# Patient Record
Sex: Female | Born: 1970 | Race: White | Hispanic: No | Marital: Married | Smoking: Never smoker
Health system: Southern US, Community
[De-identification: ages and names within clinical notes are randomized; demographics above are authoritative.]

## PROBLEM LIST (undated history)

## (undated) DIAGNOSIS — R519 Headache, unspecified: Secondary | ICD-10-CM

## (undated) DIAGNOSIS — N3010 Interstitial cystitis (chronic) without hematuria: Secondary | ICD-10-CM

## (undated) DIAGNOSIS — R51 Headache: Secondary | ICD-10-CM

## (undated) HISTORY — DX: Headache: R51

## (undated) HISTORY — DX: Headache, unspecified: R51.9

## (undated) HISTORY — PX: ABDOMINAL HYSTERECTOMY: SHX81

## (undated) HISTORY — DX: Interstitial cystitis (chronic) without hematuria: N30.10

---

## 1998-03-18 ENCOUNTER — Other Ambulatory Visit: Admission: RE | Admit: 1998-03-18 | Discharge: 1998-03-18 | Payer: Self-pay | Admitting: *Deleted

## 1998-07-29 ENCOUNTER — Ambulatory Visit (HOSPITAL_COMMUNITY): Admission: RE | Admit: 1998-07-29 | Discharge: 1998-07-29 | Payer: Self-pay | Admitting: Obstetrics and Gynecology

## 1998-09-06 ENCOUNTER — Inpatient Hospital Stay (HOSPITAL_COMMUNITY): Admission: AD | Admit: 1998-09-06 | Discharge: 1998-09-06 | Payer: Self-pay | Admitting: Obstetrics and Gynecology

## 1998-10-07 ENCOUNTER — Inpatient Hospital Stay (HOSPITAL_COMMUNITY): Admission: AD | Admit: 1998-10-07 | Discharge: 1998-10-09 | Payer: Self-pay | Admitting: *Deleted

## 1998-10-09 ENCOUNTER — Encounter (HOSPITAL_COMMUNITY): Admission: RE | Admit: 1998-10-09 | Discharge: 1999-01-07 | Payer: Self-pay | Admitting: Obstetrics and Gynecology

## 1998-11-05 ENCOUNTER — Other Ambulatory Visit: Admission: RE | Admit: 1998-11-05 | Discharge: 1998-11-05 | Payer: Self-pay | Admitting: *Deleted

## 1999-05-13 ENCOUNTER — Encounter (INDEPENDENT_AMBULATORY_CARE_PROVIDER_SITE_OTHER): Payer: Self-pay | Admitting: Specialist

## 1999-05-13 ENCOUNTER — Other Ambulatory Visit: Admission: RE | Admit: 1999-05-13 | Discharge: 1999-05-13 | Payer: Self-pay | Admitting: Otolaryngology

## 1999-11-11 ENCOUNTER — Other Ambulatory Visit: Admission: RE | Admit: 1999-11-11 | Discharge: 1999-11-11 | Payer: Self-pay | Admitting: *Deleted

## 2000-08-18 ENCOUNTER — Ambulatory Visit (HOSPITAL_COMMUNITY): Admission: RE | Admit: 2000-08-18 | Discharge: 2000-08-18 | Payer: Self-pay | Admitting: *Deleted

## 2000-10-16 ENCOUNTER — Inpatient Hospital Stay (HOSPITAL_COMMUNITY): Admission: AD | Admit: 2000-10-16 | Discharge: 2000-10-16 | Payer: Self-pay | Admitting: *Deleted

## 2000-10-26 ENCOUNTER — Inpatient Hospital Stay (HOSPITAL_COMMUNITY): Admission: AD | Admit: 2000-10-26 | Discharge: 2000-10-29 | Payer: Self-pay | Admitting: Obstetrics and Gynecology

## 2000-12-12 ENCOUNTER — Other Ambulatory Visit: Admission: RE | Admit: 2000-12-12 | Discharge: 2000-12-12 | Payer: Self-pay | Admitting: *Deleted

## 2001-12-20 ENCOUNTER — Other Ambulatory Visit: Admission: RE | Admit: 2001-12-20 | Discharge: 2001-12-20 | Payer: Self-pay | Admitting: *Deleted

## 2003-01-01 ENCOUNTER — Other Ambulatory Visit: Admission: RE | Admit: 2003-01-01 | Discharge: 2003-01-01 | Payer: Self-pay | Admitting: *Deleted

## 2003-02-11 ENCOUNTER — Encounter: Admission: RE | Admit: 2003-02-11 | Discharge: 2003-02-11 | Payer: Self-pay | Admitting: *Deleted

## 2003-02-11 ENCOUNTER — Encounter: Payer: Self-pay | Admitting: *Deleted

## 2003-10-06 ENCOUNTER — Other Ambulatory Visit: Admission: RE | Admit: 2003-10-06 | Discharge: 2003-10-06 | Payer: Self-pay | Admitting: *Deleted

## 2004-02-06 ENCOUNTER — Ambulatory Visit (HOSPITAL_COMMUNITY): Admission: RE | Admit: 2004-02-06 | Discharge: 2004-02-06 | Payer: Self-pay | Admitting: Obstetrics and Gynecology

## 2004-04-16 ENCOUNTER — Inpatient Hospital Stay: Admission: AD | Admit: 2004-04-16 | Discharge: 2004-04-16 | Payer: Self-pay

## 2004-04-16 ENCOUNTER — Inpatient Hospital Stay (HOSPITAL_COMMUNITY): Admission: RE | Admit: 2004-04-16 | Discharge: 2004-04-18 | Payer: Self-pay | Admitting: Obstetrics and Gynecology

## 2004-04-17 ENCOUNTER — Encounter (INDEPENDENT_AMBULATORY_CARE_PROVIDER_SITE_OTHER): Payer: Self-pay | Admitting: *Deleted

## 2004-05-26 ENCOUNTER — Other Ambulatory Visit: Admission: RE | Admit: 2004-05-26 | Discharge: 2004-05-26 | Payer: Self-pay | Admitting: Obstetrics and Gynecology

## 2005-05-27 ENCOUNTER — Other Ambulatory Visit: Admission: RE | Admit: 2005-05-27 | Discharge: 2005-05-27 | Payer: Self-pay | Admitting: Obstetrics and Gynecology

## 2006-10-27 ENCOUNTER — Encounter: Admission: RE | Admit: 2006-10-27 | Discharge: 2006-10-27 | Payer: Self-pay | Admitting: Obstetrics and Gynecology

## 2008-10-28 IMAGING — MG MM DIAGNOSTIC UNILATERAL R
4 series · 4 of 4 positions shown · non-contrast
Comparison: none

DG DIAGNOSTIC UNILATERAL R
CC and MLO view(s) were taken of the right breast.

RIGHT BREAST ULTRASOUND
DIGITAL UNILATERAL RIGHT DIAGNOSTIC MAMMOGRAM AND RIGHT BREAST ULTRASOUND:
CLINICAL DATA: Pain and lumpiness in the right upper outer quadrant.

[R CC]
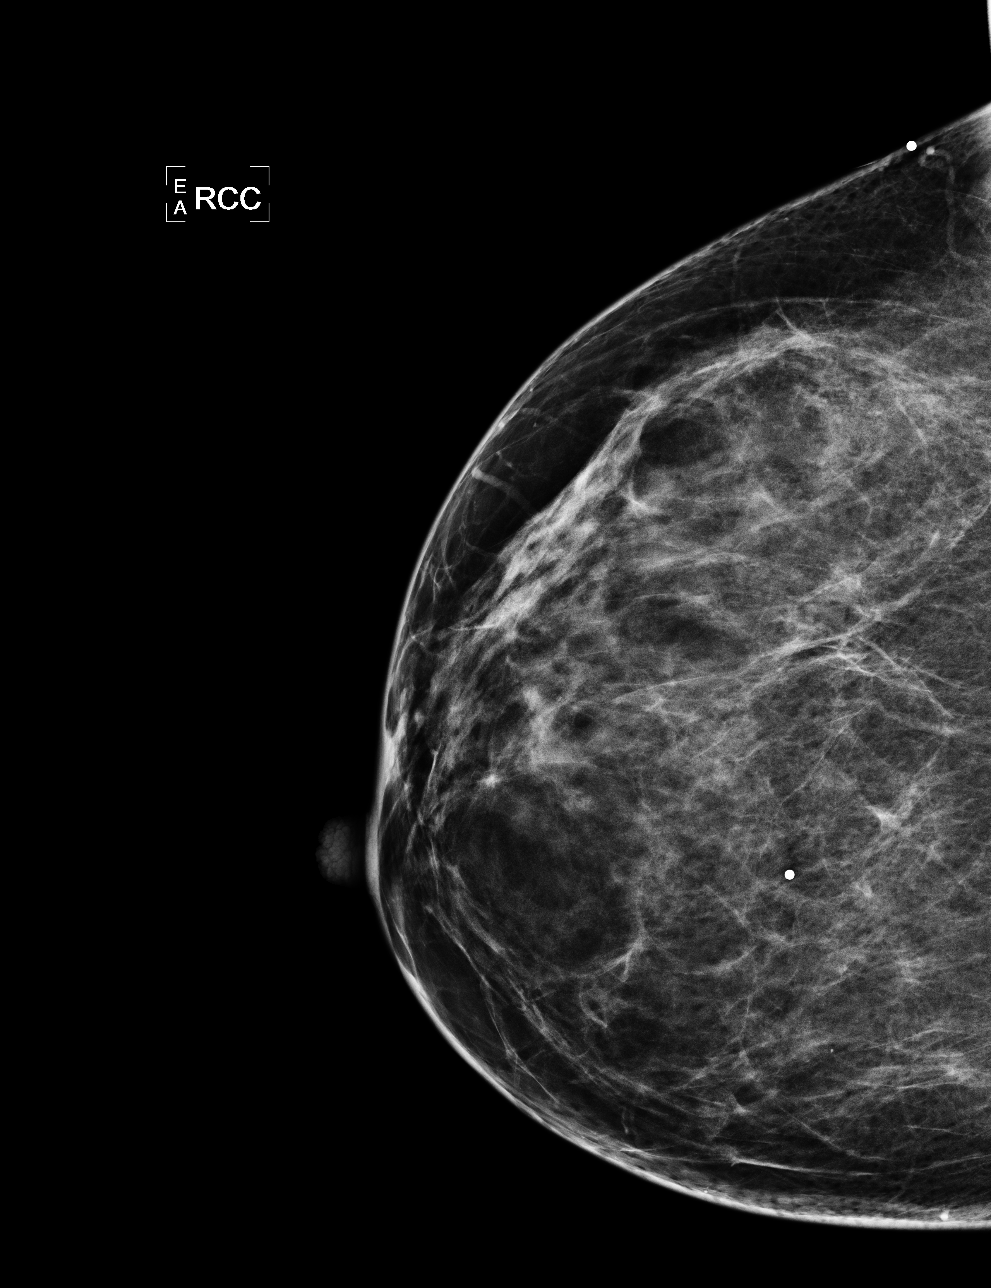

[R MLO]
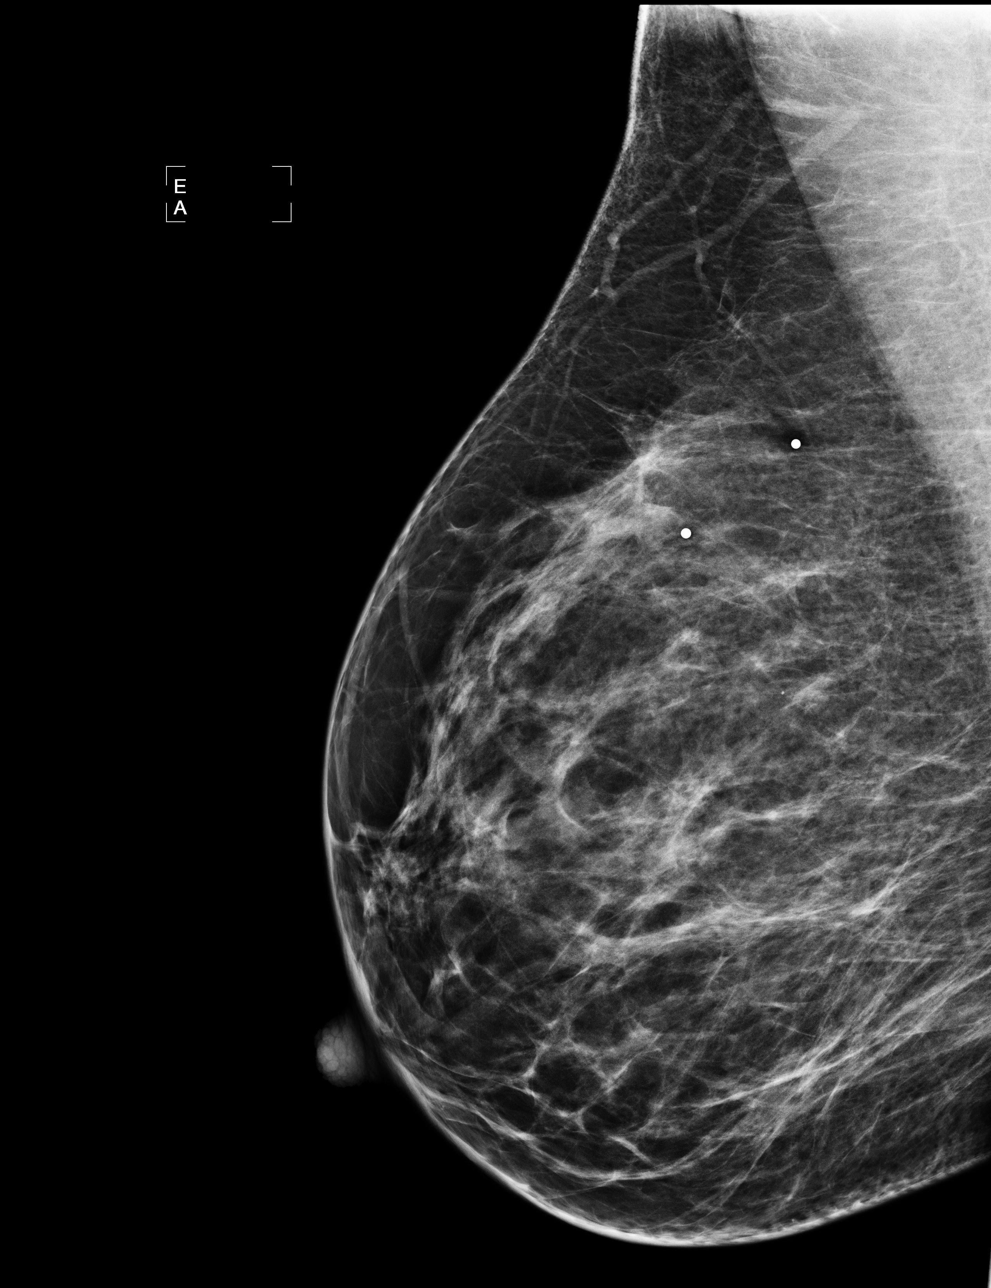

[R TAN (1 of 2)]
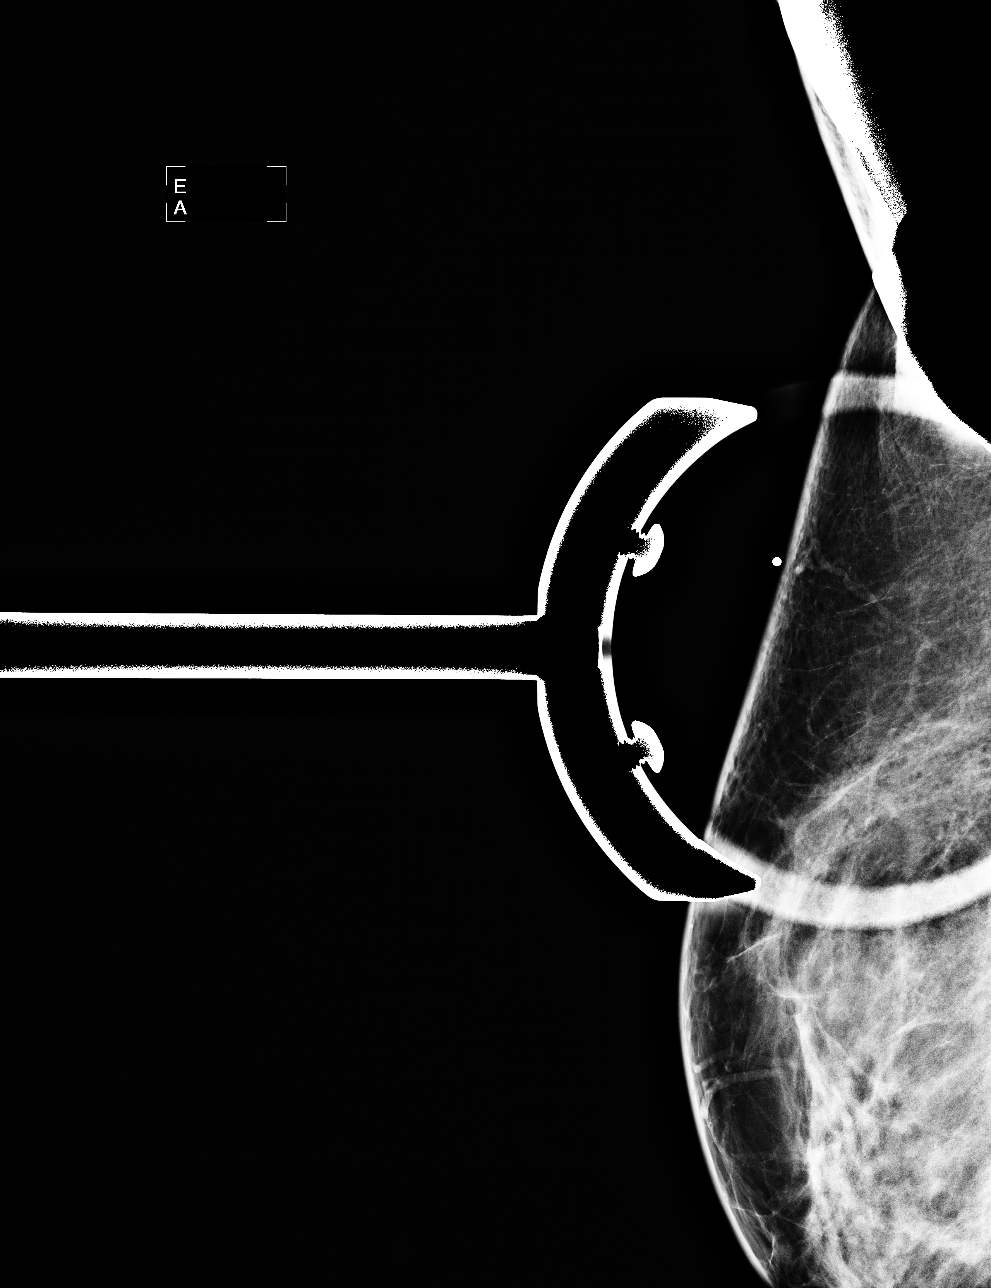

[R TAN (2 of 2)]
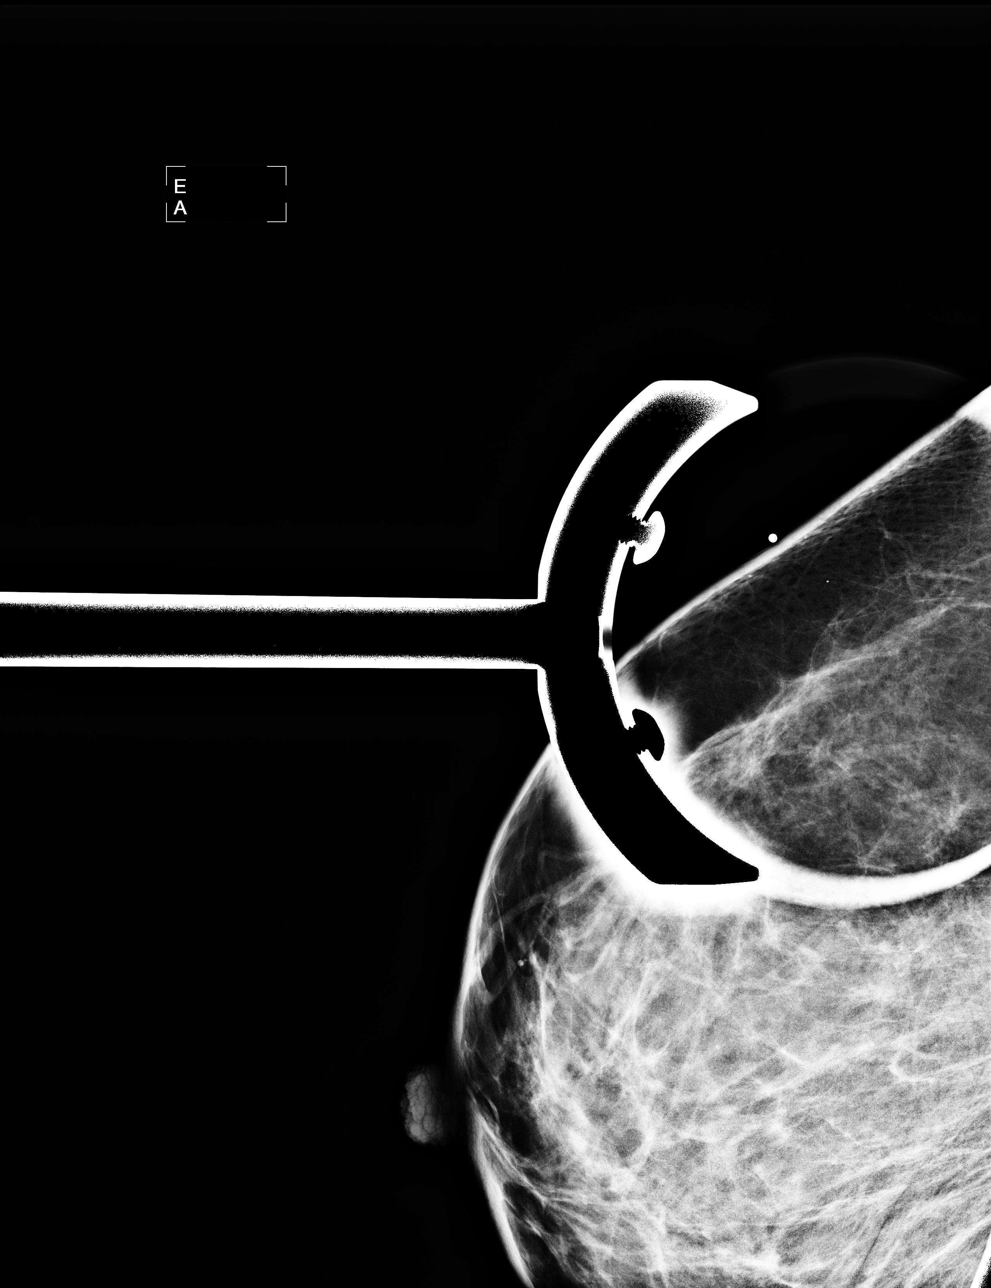

[4 of 4 positions shown; findings below may reference images not displayed]

Comparison studies are dated 12-12-05 and 02-11-03.

The fibroglandular parenchymal pattern within the right breast is stable.  No dominant masses, 
suspicious calcifications, or areas of architectural distortion are seen bilaterally.

Physical exam of the right breast reveals soft lumpy tissue.  Ultrasound of the right upper outer 
quadrant reveals normal fatty and fibroglandular tissue only.
IMPRESSION: There is no specific radiographic evidence of malignancy on the right.  Bilateral screening 
mammogram in November 2006 recommended.

ASSESSMENT: Negative - BI-RADS 1

Screening mammogram of both breasts in 2 months.
,

## 2008-12-29 ENCOUNTER — Encounter (INDEPENDENT_AMBULATORY_CARE_PROVIDER_SITE_OTHER): Payer: Self-pay | Admitting: Obstetrics and Gynecology

## 2008-12-29 ENCOUNTER — Ambulatory Visit (HOSPITAL_COMMUNITY): Admission: RE | Admit: 2008-12-29 | Discharge: 2008-12-30 | Payer: Self-pay | Admitting: Obstetrics and Gynecology

## 2010-12-07 LAB — CBC
Hemoglobin: 7 g/dL — CL (ref 12.0–15.0)
MCHC: 33.9 g/dL (ref 30.0–36.0)
MCV: 77.6 fL — ABNORMAL LOW (ref 78.0–100.0)
Platelets: 210 10*3/uL (ref 150–400)
WBC: 9.5 10*3/uL (ref 4.0–10.5)

## 2010-12-08 LAB — CBC
Hemoglobin: 11.9 g/dL — ABNORMAL LOW (ref 12.0–15.0)
MCHC: 33.7 g/dL (ref 30.0–36.0)
MCV: 77.5 fL — ABNORMAL LOW (ref 78.0–100.0)
RDW: 14 % (ref 11.5–15.5)

## 2010-12-08 LAB — BASIC METABOLIC PANEL
Chloride: 102 mEq/L (ref 96–112)
GFR calc non Af Amer: >60 mL/min (ref >60)
Glucose, Bld: 113 mg/dL — ABNORMAL HIGH (ref 70–99)
Potassium: 3.2 mEq/L — ABNORMAL LOW (ref 3.5–5.1)

## 2011-01-11 NOTE — Op Note (Signed)
[name], [name]            ACCOUNT NO.:  [id number]   MEDICAL RECORD NO.:  [id number]          PATIENT TYPE:  OIB   LOCATION:  9304                          FACILITY:  [facility]   PHYSICIAN:  Dineen Kid. Rana Snare, M.D.    DATE OF BIRTH:  [birth year]   DATE OF PROCEDURE:  DATE OF DISCHARGE:                               OPERATIVE REPORT   PREOPERATIVE DIAGNOSES:  Abnormal uterine bleeding, large fibroids,  dysmenorrhea, menorrhagia, and anemia.   POSTOPERATIVE DIAGNOSES:  Abnormal uterine bleeding, large fibroids,  dysmenorrhea, menorrhagia, and anemia.   PROCEDURE:  Laparoscopic-assisted vaginal hysterectomy.   SURGEON:  Dineen Kid. Rana Snare, MD   ASSISTANT:  Guy Sandifer. Henderson Cloud, MD   ANESTHESIA:  General endotracheal.   INDICATIONS:  [name] [name] is a 40 year old, G3, P3, status post tubal  ligation, worsening problems of menorrhagia, pelvic pain, dysmenorrhea,  menorrhagia.  She also has recurrent leiomyomas.  Last year, she had  hysteroscope resection of a submucosal fibroid with temporary resolution  of symptoms.  She is now to the point where she is bleeding excessively  on a daily basis requiring 2 NuvaRing and recently also oral  contraceptive agents on top of this just to control the bleeding.  She  has no further childbearing desires and presents for laparoscopic-  assisted vaginal hysterectomy.  She does have a large 5-cm fibroid at  the fundus which is also submucosal.   FINDINGS AT TIME OF SURGERY:  Enlarged uterus consistent with fibroids,  normal-appearing ovaries, appendix, liver.  The uterus weighs 323 g.   DESCRIPTION OF PROCEDURE:  After adequate analgesia, the patient placed  in the dorsal lithotomy position.  She was sterilely prepped and draped.  Bladder sterilely drained.  Graves speculum was placed.  A Hulka  tenaculum was placed on the cervix.  A 1-cm infraumbilical skin incision  was made.  A Veress needle was inserted.  The abdomen was insufflated,  no dullness to  percussion.  An 11-mm trocar was inserted.  The above  findings were noted.  A 5-mm trocar was inserted to the left of the  midline 2 fingerbreadths above the pubic symphysis under direct  visualization.  The left round ligament was identified, grasped with  Gyrus cutting forceps.  Dissection was carried out along the left utero-  ovarian ligament now with the left tube and ovary falling lateral to the  uterus.  The right round ligament was identified, ligated, and dissected  along with the utero-ovarian ligament and the fallopian tube with the  right tube and ovary falling to the right side.  The bladder was  elevated.  Uterovesical junction was identified, and a small window was  made at this junction.  The abdomen was then desufflated.  Legs were  repositioned.  Posterior colpotomy was performed.  The cervix was  circumscribed with Bovie cautery.  The LigaSure instruments were used to  ligate across the uterosacral ligaments bilaterally, the cardinal  ligaments bilaterally, and the bladder pillars bilaterally.  The  anterior vaginal mucosa was dissected off the anterior surface of the  cervix, and the anterior peritoneum was entered sharply.  The  Deaver  retractor placed underneath the bladder.  The inferior portions of the  broad ligament were then ligated with LigaSure and dissected with Mayo  scissors.  The uterus was removed.  The uterosacral ligaments were  identified bilaterally, ligated with 0 Monocryl suture in a figure-of-  eight fashion.  Posterior peritoneum was closed in a pursestring fashion  with 0 Monocryl suture.  The vagina was then closed in a vertical  fashion using of figure-of-eight of 0 Monocryl suture with  approximation.  Good hemostasis was noted.  Foley catheter was then  placed with return of clear yellow urine.  There was reinsufflated.  Legs repositioned.  The laparoscope was reinserted, and the pedicles  were identified and checked as was the cul-de-sac.   Small peritoneal  edge bleeders were coagulated with bipolar cautery.  Otherwise, good  hemostasis had been noted.  After copious amount of irrigation, adequate  hemostasis was assured.  Good peristalsis was noted from bilateral  ureters.  The abdomen was then desufflated, trocars removed.  The  infraumbilical skin incision was closed with 0 Vicryl interrupted suture  in the fascia, 3-0 Vicryl Rapide subcuticular suture, the 5-mm site was  closed with 3-0 Vicryl Rapide subcuticular suture and Dermabond.  Incisions were injected with 0.25% Marcaine, total 10 mL used.  The  patient was stable and transferred to recovery room.  Sponge and  instrument count was normal x3.  Estimated blood loss was 350 mL.  The  patient received 1 g of cefotetan preoperatively.      Dineen Kid Rana Snare, M.D.  Electronically Signed     DCL/MEDQ  D:  12/29/2008  T:  12/29/2008  Job:  [id number]

## 2011-01-11 NOTE — H&P (Signed)
[name] [name], [name] [name]            ACCOUNT NO.:  [id number]   MEDICAL RECORD NO.:  [id number]          PATIENT TYPE:  AMB   LOCATION:  SDC                           [facility]ILITY:  [facility]   PHYSICIAN:  Dineen Kid. Rana Snare, M.D.    DATE OF BIRTH:  1971/05/26   DATE OF ADMISSION:  12/29/2008  DATE OF DISCHARGE:                              HISTORY & PHYSICAL   HISTORY OF PRESENT ILLNESS:  [name] [name] is a 40 year old G3, P3, status  post tubal ligation with worsening problems of menorrhagia, pelvic pain,  recurrent fibroids, dysmenorrhea and menorrhagia.  She has had  hysteroscopic resection of uterine submucosal fibroids with temporary  resolution of the symptoms.  She is now to a point where she is bleeding  excessively on a daily basis, now requiring doubling up on birth control  pills just to control her bleeding.  She has no other childbearing  desires and presents for hysterectomy.  Recent ultrasound showed a large  fundal 5 cm mass consistent with a new fibroid which is displacing the  endometrial lining.   PAST MEDICAL HISTORY:  Significant only for insomnia and anemia.   PAST SURGICAL HISTORY:  She had:  1. Tubal ligation.  2. Hysteroscopy with D and C.  3. Tonsillectomy.  4. Wisdom tooth extraction.  5. She has had 3 vaginal deliveries.   MEDICATIONS:  Iron, Femcon 35 birth control pill.   She reports an allergy to PENICILLIN and sensitive to CODEINE; however,  she can take cephalosporins.   PHYSICAL EXAMINATION:  Her blood is 124/80, her weight is 149.  Hemoglobin 11.0.  HEART:  Regular rate and rhythm.  LUNGS:  Clear to auscultation bilaterally.  ABDOMEN:  Nondistended, nontender.  PELVIC:  She has normal external genitalia, Bartholin's, Skene's,  urethra.  Uterus is anteverted, mobile, slightly enlarged, nontender.  No adnexal masses are palpable.   IMPRESSION AND PLAN:  Abnormal uterine bleeding with current large  fibroids, dysmenorrhea, menorrhagia, anemia not responding  to  conservative medical management.  She desires definitive surgical  intervention and presents for hysterectomy.  Plan on preserving the  patient ovaries.  We discussed the risks and benefits of procedure  at length which include, but not limited to, risk of infection,  bleeding, damage to bowel, bladder, ureters, ovaries, risks associated  with anesthesia, risks associated with blood transfusion.  She gives her  informed consent and wishes to proceed.      Dineen Kid Rana Snare, M.D.  Electronically Signed     DCL/MEDQ  D:  12/28/2008  T:  12/28/2008  Job:  [id number]

## 2011-01-14 NOTE — Op Note (Signed)
[name] [name], [name] [name]                        ACCOUNT NO.:  [id number]   MEDICAL RECORD NO.:  [id number]                   PATIENT TYPE:  INP   LOCATION:  9122                                 [facility]ACILITY:  [facility]   PHYSICIAN:  Dineen Kid. Rana Snare, M.D.                 DATE OF BIRTH:  [birth year]   DATE OF PROCEDURE:  04/17/2004  DATE OF DISCHARGE:                                 OPERATIVE REPORT   PREOPERATIVE DIAGNOSIS:  Multiparity with desired sterility.   POSTOPERATIVE DIAGNOSIS:  Multiparity with desired sterility.   OPERATION/PROCEDURE:  Modified Pomeroy bilateral tubal ligation.   SURGEON:  Dineen Kid. Rana Snare, M.D.   ANESTHESIA:  Epidural, then spinal and local.   INDICATIONS:  [name] is a 40 year old who just delivered her third  child without any complications.  She and her husband desire sterilization  and present for that.  The risks and benefits were discussed at length  including 5 out of 1000 failure.  She does give informed consent.   FINDINGS:  Findings at the time of surgery show normal-appearing anatomy.   DESCRIPTION OF PROCEDURE:  After adequate analgesia, which required spinal  and local Marcaine with 10 mL of 0.25% Marcaine infiltrated, a 2 cm  infraumbilical skin incision was taken down to the fascia which was incised  transversely.  Peritoneum was entered sharply.  Army-[employer] retractors were  placed.  Right fallopian tube was identified and grasped with a Babcock  clamp, confirmed by the fimbriated end.  Mid portion of the tube was doubly  ligated with a 0 plain suture and a suture of 2-0 silk on the proximal  section.  It was subsequently ligated and two ___________ Junita Push were noted  and noted to be hemostatic in the central portion excised.  The left  fallopian tube was identified by the fimbriated end, mid portion grasped,  doubly ligated with 0 plain suture and a 2-0 silk suture on the proximal  portion.  Central portion was excised.  Two hemostatic ostias were  noted at  the distal and proximal ends.  Central portion was removed and tube was  released back into the peritoneal cavity.  The fascia was closed with a  running suture of 0 Vicryl.  Skin was then closed with 3-0 Vicryl repeated  subcuticular fashion.  Estimated blood loss was less than 10 mL.  The  patient was stable and transferred to the recovery room.  The sponge and  instrument count was normal x3.                                               Dineen Kid Rana Snare, M.D.    DCL/MEDQ  D:  04/17/2004  T:  04/18/2004  Job:  [id number]

## 2011-01-14 NOTE — Discharge Summary (Signed)
NAMEVERDIE, WILMS            ACCOUNT NO.:  [id number]   MEDICAL RECORD NO.:  [id number]          PATIENT TYPE:  OIB   LOCATION:  9304                          [facility]   PHYSICIAN:  Dineen Kid. Rana Snare, M.D.    DATE OF BIRTH:  1971-05-16   DATE OF ADMISSION:  12/29/2008  DATE OF DISCHARGE:  12/30/2008                               DISCHARGE SUMMARY   HISTORY OF PRESENT ILLNESS:  Ms. Kohlman is a 40 year old G3, P3, who is  status post tubal ligation, worsening problems with menorrhagia, pelvic  pain, recurrent fibroids  She also has dysmenorrhea and menorrhagia.  She has undergone hysteroscopic resection of the submucosal fibroids  with temporary resolution of symptoms.  She is now to the point that she  is excessively bleeding on a daily basis requiring 2 birth control pills  today to control her bleeding and desires potential surgical  intervention.  The largest fibroid is submucosal and 5 cm in size.   HOSPITAL COURSE:  The patient underwent a laparoscopic-assisted vaginal  hysterotomy.  Surgery was uncomplicated.  She did have 350 mL of blood  loss.  On postoperative day #1, she was ambulating without difficulty  and tolerating a regular diet, requiring very minimal pain medicines.  She did have moderate drop in the hemoglobin down to a hemoglobin of  7.0, but remained clinically stable.  She had no vaginal bleeding.  Normal pelvic exam.  The incisions were clean, dry, and intact with  normoactive bowel sounds and was not orthostatic.  The patient desired  discharge to home and was discharged home with follow up in the office  in 2 days for a recheck of hemoglobin.  I told her to return for  increased pain, fever, bleeding, weakness.  She does have a prescription  of Tylox #30 at home to take as needed.      Dineen Kid Rana Snare, M.D.  Electronically Signed     DCL/MEDQ  D:  01/14/2009  T:  01/15/2009  Job:  [id number]

## 2016-07-06 ENCOUNTER — Ambulatory Visit (INDEPENDENT_AMBULATORY_CARE_PROVIDER_SITE_OTHER): Payer: Managed Care, Other (non HMO) | Admitting: Neurology

## 2016-07-06 ENCOUNTER — Encounter: Payer: Self-pay | Admitting: Neurology

## 2016-07-06 DIAGNOSIS — G43709 Chronic migraine without aura, not intractable, without status migrainosus: Secondary | ICD-10-CM | POA: Insufficient documentation

## 2016-07-06 DIAGNOSIS — R202 Paresthesia of skin: Secondary | ICD-10-CM

## 2016-07-06 DIAGNOSIS — IMO0002 Reserved for concepts with insufficient information to code with codable children: Secondary | ICD-10-CM

## 2016-07-06 MED ORDER — ONDANSETRON 4 MG PO TBDP: 4.0000 mg | ORAL_TABLET | Freq: Three times a day (TID) | ORAL | 6 refills | Status: DC | PRN

## 2016-07-06 MED ORDER — SUMATRIPTAN SUCCINATE 25 MG PO TABS: 25.0000 mg | ORAL_TABLET | ORAL | 6 refills | Status: AC | PRN

## 2016-07-06 NOTE — Patient Instructions (Signed)
Magnesium oxide 400 mg twice a day Riboflavin  100 mg twice a day 

## 2016-07-06 NOTE — Progress Notes (Signed)
PATIENT: [name] [name] DOB: 10-30-1970  Chief Complaint  Patient presents with  . Headaches    Reports daily headaches, that vary in severity, since October 2017.  She has tried OTC NSAIDS.  Dr. Rana SnareLowe though it may be due to tension and she is now using cyclobenzaprine, with limited relief.  . GYN/Referring MD    Candice Campavid Lowe, MD     HISTORICAL  [name] [name] is a 45 years old right-handed female, seen in refer by her primary care doctor Candice CampDavid Lowe for evaluation of headaches, initial evaluation was on November eighth 2017  She reported a lot of stress recently, was tearful during today's visit, she had occasionally headache in the past, about once a week she take Tylenol as needed for headache, she contributed to tension, and the pressure,  Since and of September 2017, she experienced daily headaches, behind her eyes, at her upper teeth, moderate 6 out of 10 for right now on a daily basis, but oftentimes it would exacerbated to a much severe 10 out of 10 headache was associated light noise sensitivity, mild nausea, lasting for a few hours, she has tried multiple dose of over-the-counter Advil with limited result,  She also complains intermittent left arm numbness, generalized fatigue, weakness.  She denies visual loss, complains sensitivity to medications, was treated with Elavil 10 mg in the past for interstitial cystitis with intolerable side effect, dizziness, difficulty focusing  REVIEW OF SYSTEMS: Full 14 system review of systems performed and notable only for headaches.  ALLERGIES: Allergies  Allergen Reactions  . Penicillins Rash    HOME MEDICATIONS: Current Outpatient Prescriptions  Medication Sig Dispense Refill  . cyclobenzaprine (FLEXERIL) 10 MG tablet daily as needed.    . diazepam (VALIUM) 5 MG tablet Take as needed for IC flare-ups.    . fexofenadine (ALLEGRA) 180 MG tablet Take 180 mg by mouth daily.    . Hydrocodone-Acetaminophen 5-300 MG TABS Take by  mouth. Take as needed for IC flare-ups.    . Meth-Hyo-M Bl-Na Phos-Ph Sal (URIBEL) 118 MG CAPS Takes as needed for IC.     No current facility-administered medications for this visit.     PAST MEDICAL HISTORY: Past Medical History:  Diagnosis Date  . Headache   . Interstitial cystitis     PAST SURGICAL HISTORY: Past Surgical History:  Procedure Laterality Date  . ABDOMINAL HYSTERECTOMY      FAMILY HISTORY: Family History  Problem Relation Age of Onset  . Breast cancer Mother   . Fibromyalgia Mother   . Hypertension Mother   . Prostate cancer Father   . Diabetes Father   . Pancreatic cancer Maternal Grandmother   . Diabetes Maternal Grandmother     SOCIAL HISTORY:  Social History   Social History  . Marital status: Married    Spouse name: N/A  . Number of children: 3  . Years of education: 2 years tech school   Occupational History  . Billing/Coding    Social History Main Topics  . Smoking status: Never Smoker  . Smokeless tobacco: Never Used  . Alcohol use No  . Drug use: No  . Sexual activity: Not on file   Other Topics Concern  . Not on file   Social History Narrative   Lives at home with her husband and children.   Right-handed.   No caffeine use.     PHYSICAL EXAM   Vitals:   07/06/16 1315  BP: 124/87  Pulse: 91  Weight:  136 lb (61.7 kg)  Height: 5\' 2"  (1.575 m)    Not recorded      Body mass index is 24.87 kg/m.  PHYSICAL EXAMNIATION:  Gen: NAD, conversant, well nourised, obese, well groomed                     Cardiovascular: Regular rate rhythm, no peripheral edema, warm, nontender. Eyes: Conjunctivae clear without exudates or hemorrhage Neck: Supple, no carotid bruits. Pulmonary: Clear to auscultation bilaterally   NEUROLOGICAL EXAM:  MENTAL STATUS: Speech:    Speech is normal; fluent and spontaneous with normal comprehension.  Cognition:     Orientation to time, place and person     Normal recent and remote memory      Normal Attention span and concentration     Normal Language, naming, repeating,spontaneous speech     Fund of knowledge   CRANIAL NERVES: CN II: Visual fields are full to confrontation. Fundoscopic exam is normal with sharp discs and no vascular changes. Pupils are round equal and briskly reactive to light. CN III, IV, VI: extraocular movement are normal. No ptosis. CN V: Facial sensation is intact to pinprick in all 3 divisions bilaterally. Corneal responses are intact.  CN VII: Face is symmetric with normal eye closure and smile. CN VIII: Hearing is normal to rubbing fingers CN IX, X: Palate elevates symmetrically. Phonation is normal. CN XI: Head turning and shoulder shrug are intact CN XII: Tongue is midline with normal movements and no atrophy.  MOTOR: There is no pronator drift of out-stretched arms. Muscle bulk and tone are normal. Muscle strength is normal.  REFLEXES: Reflexes are 2+ and symmetric at the biceps, triceps, knees, and ankles. Plantar responses are flexor.  SENSORY: Intact to light touch, pinprick, positional sensation and vibratory sensation are intact in fingers and toes.  COORDINATION: Rapid alternating movements and fine finger movements are intact. There is no dysmetria on finger-to-nose and heel-knee-shin.    GAIT/STANCE: Posture is normal. Gait is steady with normal steps, base, arm swing, and turning. Heel and toe walking are normal. Tandem gait is normal.  Romberg is absent.   DIAGNOSTIC DATA (LABS, IMAGING, TESTING) - I reviewed patient records, labs, notes, testing and imaging myself where available.   ASSESSMENT AND PLAN  [name] [name] is a 45 y.o. female   Headaches With Migraine features, Imitrex 25mg , Zofran 4mg  prn for migraine.  Left arm paresthesia After discuss with patient, we decide to proceed with MRI brain   Levert FeinsteinYijun Jahlil Ziller, M.D. Ph.D.  [facility] [address], [address] [place], [place] [address] Ph:  [phone] Fax: [phone]  CC: Candice Campavid Lowe, MD

## 2016-07-07 ENCOUNTER — Telehealth: Payer: Self-pay | Admitting: Neurology

## 2016-07-07 NOTE — Telephone Encounter (Signed)
Pt called stating [facility] will need to have a different order put in. The one sent to them is not correct.

## 2016-07-07 NOTE — Telephone Encounter (Signed)
Sara/[facility] [phone] called to advise they only do MRI brain w/wo but not limited. Please call

## 2016-07-08 ENCOUNTER — Telehealth: Payer: Self-pay | Admitting: Neurology

## 2016-07-08 NOTE — Telephone Encounter (Signed)
Patient called and requested to move apt up. We moved her apt to November 15th on our mobile unit.

## 2016-07-08 NOTE — Telephone Encounter (Signed)
Informed [facility] that it has been changed.

## 2016-07-08 NOTE — Addendum Note (Signed)
Addended by: Levert FeinsteinYAN, Avenir Lozinski on: 07/08/2016 08:56 AM   Modules accepted: Orders

## 2016-07-08 NOTE — Telephone Encounter (Signed)
Please let [facility] now, I have reentered MRI of brain without contrast ordered.

## 2016-07-11 NOTE — Telephone Encounter (Signed)
The patient called back and I spoke with her and scheduled her appt for 07/13/16 at 1:30 on the [facility] mobile unit.

## 2016-07-11 NOTE — Telephone Encounter (Signed)
Called patient right back and left a vmail for her to call me back about scheduling her MRI

## 2016-07-11 NOTE — Telephone Encounter (Signed)
Patient returned [name] [name]'s call. Please call work number [phone].

## 2016-07-12 ENCOUNTER — Telehealth: Payer: Self-pay | Admitting: Neurology

## 2016-07-12 NOTE — Telephone Encounter (Signed)
I have done authorization for MRI brain wo, no will be faxed over.

## 2016-07-13 ENCOUNTER — Ambulatory Visit (INDEPENDENT_AMBULATORY_CARE_PROVIDER_SITE_OTHER): Payer: Managed Care, Other (non HMO)

## 2016-07-13 DIAGNOSIS — G43709 Chronic migraine without aura, not intractable, without status migrainosus: Secondary | ICD-10-CM | POA: Diagnosis not present

## 2016-07-13 DIAGNOSIS — IMO0002 Reserved for concepts with insufficient information to code with codable children: Secondary | ICD-10-CM

## 2016-07-13 DIAGNOSIS — R202 Paresthesia of skin: Secondary | ICD-10-CM | POA: Diagnosis not present

## 2016-07-14 ENCOUNTER — Other Ambulatory Visit: Payer: Self-pay

## 2016-07-15 ENCOUNTER — Telehealth: Payer: Self-pay | Admitting: *Deleted

## 2016-07-15 NOTE — Telephone Encounter (Signed)
Attempted to reach patient re: MRI results. Unable to LVM; phone went dead.

## 2016-08-03 ENCOUNTER — Ambulatory Visit: Payer: Managed Care, Other (non HMO) | Admitting: Neurology

## 2016-08-09 ENCOUNTER — Telehealth: Payer: Self-pay | Admitting: Neurology

## 2016-08-09 NOTE — Telephone Encounter (Signed)
Ok to switch to Dr. Lucia GaskinsAhern if she agrees.

## 2016-08-09 NOTE — Telephone Encounter (Signed)
Pt is scheduled 12/15 f/u for migraines  No new symptoms

## 2016-08-09 NOTE — Telephone Encounter (Signed)
Pt request to change providers, to possibly Dr Lucia GaskinsAhern for migraines. Pt is aware a decision will be made by the providers and she will receive a phone call with the decision.  In the meantime she is scheduled with Carolyn/NP 08/12/16 for f/u

## 2016-08-09 NOTE — Telephone Encounter (Signed)
[name] [name], I will agree to see her but not until mid January or later or later as I am booked out and any open slots I have now or get this month will go to patients I have already waiting. She should see Dr. Terrace ArabiaYan this month if needed and I can see her in January in a follow up appointment slot. Feel free to call her and schedule in mid January or later unfortunately.

## 2016-08-10 NOTE — Telephone Encounter (Signed)
Returned call to pt - she has a pending appt on 08/12/16 w/ Eber JonesCarolyn that she would like to keep.  Offered to go ahead and schedule w/ Dr. Lucia GaskinsAhern but she would like to wait until she is here on Friday.

## 2016-08-12 ENCOUNTER — Ambulatory Visit (INDEPENDENT_AMBULATORY_CARE_PROVIDER_SITE_OTHER): Payer: Managed Care, Other (non HMO) | Admitting: Nurse Practitioner

## 2016-08-12 ENCOUNTER — Encounter: Payer: Self-pay | Admitting: Nurse Practitioner

## 2016-08-12 VITALS — BP 113/71 | HR 77 | Ht 62.0 in | Wt 136.0 lb

## 2016-08-12 DIAGNOSIS — G43709 Chronic migraine without aura, not intractable, without status migrainosus: Secondary | ICD-10-CM

## 2016-08-12 DIAGNOSIS — IMO0002 Reserved for concepts with insufficient information to code with codable children: Secondary | ICD-10-CM

## 2016-08-12 DIAGNOSIS — R202 Paresthesia of skin: Secondary | ICD-10-CM

## 2016-08-12 MED ORDER — TOPIRAMATE 25 MG PO TABS: 25.0000 mg | ORAL_TABLET | Freq: Every day | ORAL | 3 refills | Status: AC

## 2016-08-12 NOTE — Progress Notes (Signed)
[facility]  PATIENT: [name] BevelsJennifer M [id number] DOB: [birth year]   REASON FOR VISIT: Follow-up for headache  HISTORY FROM: Patient    HISTORY OF PRESENT ILLNESS: Update 12/15/2017CM Ms. Gaffey, 45 year old female returns for follow-up with history of headache with features of migraine. She has light sensitivity. She does not get nauseated or vomit. Her headaches are on a daily basis. They have not kept her from missing work. She has been on Elavil in the past but had difficulty tolerating the medication. She has taken over-the-counter indications without success. She was on Flexeril did not find that to be effective. She is not aware of any foods that cause problems. She has numbness on the top of the left foot. She returns for reevaluation. MRI of the brain 07/14/2016 was normal.   HISTORY 07/06/16 Gala LewandowskyYYJennifer M [name] is a 45 years old right-handed female, seen in refer by her primary care doctor Candice Campavid Lowe for evaluation of headaches, initial evaluation was on November eighth 2017 She reported a lot of stress recently, was tearful during today's visit, she had occasionally headache in the past, about once a week she take Tylenol as needed for headache, she contributed to tension, and the pressure, Since and of September 2017, she experienced daily headaches, behind her eyes, at her upper teeth, moderate 6 out of 10 for right now on a daily basis, but oftentimes it would exacerbated to a much severe 10 out of 10 headache was associated light noise sensitivity, mild nausea, lasting for a few hours, she has tried multiple dose of over-the-counter Advil with limited result, She also complains intermittent left arm numbness, generalized fatigue, weakness. She denies visual loss, complains sensitivity to medications, was treated with Elavil 10 mg in the past for interstitial cystitis with intolerable side effect, dizziness, difficulty focusing   REVIEW OF SYSTEMS: Full 14 system  review of systems performed and notable only for those listed, all others are neg:  Constitutional: neg  Cardiovascular: neg Ear/Nose/Throat: neg  Skin: neg Eyes: Light sensitivity Respiratory: neg Gastroitestinal: neg  Hematology/Lymphatic: neg  Endocrine: neg Musculoskeletal:neg Allergy/Immunology: neg Neurological: Headache Psychiatric: neg Sleep : neg   ALLERGIES: Allergies  Allergen Reactions  . Penicillins Rash    HOME MEDICATIONS: Outpatient Medications Prior to Visit  Medication Sig Dispense Refill  . diazepam (VALIUM) 5 MG tablet Take as needed for IC flare-ups.    . fexofenadine (ALLEGRA) 180 MG tablet Take 180 mg by mouth daily.    . Hydrocodone-Acetaminophen 5-300 MG TABS Take by mouth. Take as needed for IC flare-ups.    . Meth-Hyo-M Bl-Na Phos-Ph Sal (URIBEL) 118 MG CAPS Takes as needed for IC.    Marland Kitchen. SUMAtriptan (IMITREX) 25 MG tablet Take 1 tablet (25 mg total) by mouth every 2 (two) hours as needed for migraine. May repeat in 2 hours if headache persists or recurs. 12 tablet 6  . cyclobenzaprine (FLEXERIL) 10 MG tablet daily as needed.    . ondansetron (ZOFRAN ODT) 4 MG disintegrating tablet Take 1 tablet (4 mg total) by mouth every 8 (eight) hours as needed. (Patient not taking: Reported on 08/12/2016) 20 tablet 6   No facility-administered medications prior to visit.     PAST MEDICAL HISTORY: Past Medical History:  Diagnosis Date  . Headache   . Interstitial cystitis     PAST SURGICAL HISTORY: Past Surgical History:  Procedure Laterality Date  . ABDOMINAL HYSTERECTOMY      FAMILY HISTORY: Family History  Problem Relation Age of Onset  .  Breast cancer Mother   . Fibromyalgia Mother   . Hypertension Mother   . Prostate cancer Father   . Diabetes Father   . Pancreatic cancer Maternal Grandmother   . Diabetes Maternal Grandmother     SOCIAL HISTORY: Social History   Social History  . Marital status: Married    Spouse name: N/A  . Number  of children: 3  . Years of education: 2 years tech school   Occupational History  . Billing/Coding    Social History Main Topics  . Smoking status: Never Smoker  . Smokeless tobacco: Never Used  . Alcohol use No  . Drug use: No  . Sexual activity: Not on file   Other Topics Concern  . Not on file   Social History Narrative   Lives at home with her husband and children.   Right-handed.   No caffeine use.     PHYSICAL EXAM  Vitals:   08/12/16 0950  BP: 113/71  Pulse: 77  Height: 5\' 2"  (1.575 m)   There is no height or weight on file to calculate BMI.  Generalized: Well developed, in no acute distress  Head: normocephalic and atraumatic,. Oropharynx benign  Neck: Supple, no carotid bruits  Cardiac: Regular rate rhythm, no murmur  Musculoskeletal: No deformity   Neurological examination   Mentation: Alert oriented to time, place, history taking. Attention span and concentration appropriate. Recent and remote memory intact.  Follows all commands speech and language fluent.   Cranial nerve II-XII: Fundoscopic exam reveals sharp disc margins.Pupils were equal round reactive to light extraocular movements were full, visual field were full on confrontational test. Facial sensation and strength were normal. hearing was intact to finger rubbing bilaterally. Uvula tongue midline. head turning and shoulder shrug were normal and symmetric.Tongue protrusion into cheek strength was normal. Motor: normal bulk and tone, full strength in the BUE, BLE, fine finger movements normal, no pronator drift. No focal weakness Sensory: normal and symmetric to light touch, pinprick, and  Vibration, in the upper and lower extremities Coordination: finger-nose-finger, heel-to-shin bilaterally, no dysmetria Reflexes: Brachioradialis 2/2, biceps 2/2, triceps 2/2, patellar 2/2, Achilles 2/2, plantar responses were flexor bilaterally. Gait and Station: Rising up from seated position without assistance,  normal stance,  moderate stride, good arm swing, smooth turning, able to perform tiptoe, and heel walking without difficulty. Tandem gait is steady  DIAGNOSTIC DATA (LABS, IMAGING, TESTING)   ASSESSMENT AND PLAN  45 y.o. year old female  has a past medical history of Headache and Interstitial cystitis. here to follow-up for her daily headaches with Migraine features,. MRI of the brain 07/14/2016 was normal  Topamax 25mg  po hs daily for 1 week then increase to 2 tabs hs Continue Imitrex acutely Given list of foods that are migraine triggers Importance of keeping a diary if headaches worsen to include the time of the headache what you're doing any other specific information that would be useful.  Discussed stress relief techniques such as deep breathing muscle relaxation mental relaxation to music. Discussed importance of exercise, regular meals  and sleep. Sleep deprivation can be a migraine trigger Pt is transferring care to Dr. Lucia GaskinsAhern I spent additional 15 minutes in total face to face time with the patient more than 50% of which was spent counseling and coordination of care, reviewing test results reviewing medications and discussing and reviewing the diagnosis of migraine and further treatment options. Nilda RiggsNancy Carolyn Martin, [facility] [place] LPGNP, [facility], APRN  [facility] [address], [address] [place] [address] [phone]

## 2016-08-12 NOTE — Patient Instructions (Addendum)
Topamax 25mg  po hs daily for 1 week then increase to 2 tabs hs Continue Imitrex acutely Given list of foods that are migraine triggers Importance of keeping a diary if headaches worsen to include the time of the headache what you're doing any other specific information that would be useful.  Discussed stress relief techniques such as deep breathing muscle relaxation mental relaxation to music. Discussed importance of exercise, regular meals  and sleep. Sleep deprivation can be a migraine trigger Pt is transferring care to Dr. Lucia GaskinsAhern

## 2016-08-15 NOTE — Progress Notes (Signed)
I have reviewed and agreed above plan. 

## 2016-09-28 ENCOUNTER — Ambulatory Visit: Payer: Managed Care, Other (non HMO) | Admitting: Neurology

## 2020-09-21 ENCOUNTER — Other Ambulatory Visit: Payer: Self-pay | Admitting: Obstetrics and Gynecology

## 2020-09-22 ENCOUNTER — Other Ambulatory Visit: Payer: Self-pay | Admitting: Obstetrics and Gynecology

## 2020-09-22 DIAGNOSIS — Z803 Family history of malignant neoplasm of breast: Secondary | ICD-10-CM

## 2020-10-04 ENCOUNTER — Ambulatory Visit: Payer: Self-pay
